# Patient Record
Sex: Male | Born: 1980 | Race: White | Hispanic: No | Marital: Single | Smoking: Never smoker
Health system: Southern US, Community
[De-identification: ages and names within clinical notes are randomized; demographics above are authoritative.]

## PROBLEM LIST (undated history)

## (undated) HISTORY — PX: APPENDECTOMY: SHX54

---
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
## 2004-12-28 ENCOUNTER — Emergency Department: Payer: Self-pay | Admitting: Internal Medicine

## 2008-01-21 ENCOUNTER — Inpatient Hospital Stay: Payer: Self-pay | Admitting: Vascular Surgery

## 2008-03-27 ENCOUNTER — Emergency Department (HOSPITAL_COMMUNITY): Admission: EM | Admit: 2008-03-27 | Discharge: 2008-03-28 | Payer: Self-pay | Admitting: Emergency Medicine

## 2009-07-08 ENCOUNTER — Emergency Department: Payer: Self-pay | Admitting: Emergency Medicine

## 2011-04-05 ENCOUNTER — Emergency Department: Payer: Self-pay | Admitting: *Deleted

## 2014-08-17 ENCOUNTER — Emergency Department: Payer: Self-pay | Admitting: Emergency Medicine

## 2014-08-19 ENCOUNTER — Emergency Department: Payer: Self-pay | Admitting: Student

## 2014-08-30 ENCOUNTER — Other Ambulatory Visit: Payer: Self-pay | Admitting: Unknown Physician Specialty

## 2017-08-20 ENCOUNTER — Emergency Department: Payer: Self-pay

## 2017-08-20 ENCOUNTER — Other Ambulatory Visit: Payer: Self-pay

## 2017-08-20 ENCOUNTER — Encounter: Payer: Self-pay | Admitting: Emergency Medicine

## 2017-08-20 ENCOUNTER — Emergency Department
Admission: EM | Admit: 2017-08-20 | Discharge: 2017-08-20 | Disposition: A | Discharge status: Home / Self Care | Payer: Self-pay | Attending: Student in an Organized Health Care Education/Training Program | Admitting: Student in an Organized Health Care Education/Training Program

## 2017-08-20 DIAGNOSIS — M109 Gout, unspecified: Secondary | ICD-10-CM | POA: Insufficient documentation

## 2017-08-20 DIAGNOSIS — Z87891 Personal history of nicotine dependence: Secondary | ICD-10-CM | POA: Insufficient documentation

## 2017-08-20 MED ORDER — COLCHICINE 0.6 MG PO TABS: ORAL_TABLET | ORAL | 0 refills | Status: DC

## 2017-08-20 MED ORDER — KETOROLAC TROMETHAMINE 30 MG/ML IJ SOLN
30.0000 mg | Freq: Once | INTRAMUSCULAR | Status: AC
  Administered 2017-08-20: 30 mg via INTRAMUSCULAR
  Filled 2017-08-20: qty 1

## 2017-08-20 MED ORDER — HYDROCODONE-ACETAMINOPHEN 5-325 MG PO TABS: 1.0000 | ORAL_TABLET | Freq: Four times a day (QID) | ORAL | 0 refills | Status: AC | PRN

## 2017-08-20 NOTE — ED Provider Notes (Signed)
[facility] Emergency Department Provider Note   ____________________________________________   First MD Initiated Contact with Patient 08/20/17 2204     (approximate)  I have reviewed the triage vital signs and the nursing notes.   HISTORY  Chief Complaint Foot Pain   HPI Luis Mcclain is a 37 y.o. male is here with complaint of left foot pain.  Patient denies any recent injury.  He states that this happened a couple months ago but went away on its own without any over-the-counter medication.  Patient states now for the last week pain is localized around the base of his left great toe and walking increases his pain.  He has not taken any over-the-counter medication for his pain.  Currently rates it an 8 out of 10.  History reviewed. No pertinent past medical history.  There are no active problems to display for this patient.   Past Surgical History:  Procedure Laterality Date  . APPENDECTOMY      Prior to Admission medications   Medication Sig Start Date End Date Taking? Authorizing Provider  colchicine 0.6 MG tablet 2 tablet with food once for first day, then 1 tablet bid x 2 days for gout 08/20/17   Tommi RumpsSummers, Brookley Spitler L, PA-C  HYDROcodone-acetaminophen (NORCO/VICODIN) 5-325 MG tablet Take 1 tablet by mouth every 6 (six) hours as needed for moderate pain. 08/20/17   Tommi RumpsSummers, Alexarae Oliva L, PA-C    Allergies Patient has no known allergies.  History reviewed. No pertinent family history.  Social History Social History   Tobacco Use  . Smoking status: Never Smoker  . Smokeless tobacco: Former Engineer, water[employer]  Substance Use Topics  . Alcohol use: Yes    Comment: occasional  . Drug use: No    Review of Systems Constitutional: No fever/chills Cardiovascular: Denies chest pain. Respiratory: Denies shortness of breath. Musculoskeletal: Positive for left foot pain. Skin: Negative for rash. Neurological: Negative for  focal weakness or  numbness. ____________________________________________   PHYSICAL EXAM:  VITAL SIGNS: ED Triage Vitals  Enc Vitals Group     BP 08/20/17 2034 (!) 143/70     Pulse Rate 08/20/17 2034 (!) 102     Resp 08/20/17 2034 17     Temp 08/20/17 2034 98.2 F (36.8 C)     Temp Source 08/20/17 2034 Oral     SpO2 08/20/17 2034 97 %     Weight 08/20/17 2034 220 lb (99.8 kg)     Height 08/20/17 2034 5\' 8"  (1.727 m)     Head Circumference --      Peak Flow --      Pain Score 08/20/17 2039 8     Pain Loc --      Pain Edu? --      Excl. in GC? --    Constitutional: Alert and oriented. Well appearing and in no acute distress. Eyes: Conjunctivae are normal.  Head: Atraumatic. Neck: No stridor.   Cardiovascular: Normal rate, regular rhythm. Grossly normal heart sounds.  Good peripheral circulation. Respiratory: Normal respiratory effort.  No retractions. Lungs CTAB. Musculoskeletal: Left foot no gross deformity is noted.  There is marked tenderness with erythema at the MP joint of the first digit.  Skin is intact but warm to touch in that area.  Range of motion is restricted secondary to pain.   Neurologic:  Normal speech and language. No gross focal neurologic deficits are appreciated.  Skin:  Skin is warm, dry and intact. No rash noted. Psychiatric: Mood and affect  are normal. Speech and behavior are normal.  ____________________________________________   LABS (all labs ordered are listed, but only abnormal results are displayed)  Labs Reviewed - No data to display  RADIOLOGY  ED MD interpretation:   Left foot x-ray is negative for acute bony injury.  Official radiology report(s): Dg Foot 2 Views Left  Result Date: 08/20/2017 CLINICAL DATA:  37 y/o  M; pain of first metatarsal head. EXAM: LEFT FOOT - 2 VIEW COMPARISON:  None. FINDINGS: There is no evidence of fracture or dislocation. There is no evidence of arthropathy or other focal bone abnormality. Soft tissues are unremarkable.  Plantar calcaneal enthesophyte. IMPRESSION: No acute bony or articular abnormality identified. Electronically Signed   By: Mitzi Hansen M.D.   On: 08/20/2017 21:16    ____________________________________________   PROCEDURES  Procedure(s) performed: None  Procedures  Critical Care performed: No  ____________________________________________   INITIAL IMPRESSION / ASSESSMENT AND PLAN / ED COURSE Patient was made aware that where he is hurting at and the erythematous area at the base of his great toe is suggestive for gout.  Patient was given an injection of Toradol 30 mg IM while in the department.  He was also given a prescription for Norco 1 every 6 hours as needed for pain and colchicine.  He is aware that he will discontinue taking this medication should he develop any diarrhea or stomach issues.  He is encouraged to [facility]-up with [facility] or [facility] if any continued problems. ____________________________________________   FINAL CLINICAL IMPRESSION(S) / ED DIAGNOSES  Final diagnoses:  Acute gout involving toe of left foot, unspecified cause     ED Discharge Orders        Ordered    HYDROcodone-acetaminophen (NORCO/VICODIN) 5-325 MG tablet  Every 6 hours PRN     08/20/17 2248    colchicine 0.6 MG tablet     08/20/17 2248       Note:  This document was prepared using Dragon voice recognition software and may include unintentional dictation errors.    Tommi Rumps, PA-C 08/20/17 2306    Willy Eddy, MD 08/20/17 [phone]

## 2017-08-20 NOTE — Discharge Instructions (Signed)
[facility] or [facility] if any continued problems with your foot.  A prescription for pain medication was written if needed.  Do not drive or operate machinery while taking this medication.  Colchicine only as directed.  Discontinue taking this medication should you develop diarrhea.

## 2017-08-20 NOTE — ED Triage Notes (Signed)
Pt reports left foot pain, specifically around joint of left great toe; pt says he's been working out and the pain has been there for a week; painful to ambulate; had a similar episode about 2 months ago and resolved after about 1 week without intervention;

## 2019-05-31 ENCOUNTER — Other Ambulatory Visit: Payer: Self-pay

## 2021-07-11 ENCOUNTER — Other Ambulatory Visit: Payer: Self-pay | Admitting: Internal Medicine

## 2021-07-11 DIAGNOSIS — R7989 Other specified abnormal findings of blood chemistry: Secondary | ICD-10-CM

## 2021-07-14 ENCOUNTER — Other Ambulatory Visit: Payer: Self-pay

## 2021-07-14 ENCOUNTER — Ambulatory Visit
Admission: RE | Admit: 2021-07-14 | Discharge: 2021-07-14 | Disposition: A | Discharge status: Home / Self Care | Payer: Self-pay | Source: Ambulatory Visit | Attending: Internal Medicine | Admitting: Internal Medicine

## 2021-07-14 DIAGNOSIS — R7989 Other specified abnormal findings of blood chemistry: Secondary | ICD-10-CM | POA: Insufficient documentation

## 2021-07-25 ENCOUNTER — Other Ambulatory Visit: Payer: Self-pay | Admitting: Nurse Practitioner

## 2021-07-25 DIAGNOSIS — K429 Umbilical hernia without obstruction or gangrene: Secondary | ICD-10-CM

## 2021-07-25 DIAGNOSIS — R1012 Left upper quadrant pain: Secondary | ICD-10-CM

## 2021-07-25 DIAGNOSIS — K7010 Alcoholic hepatitis without ascites: Secondary | ICD-10-CM

## 2021-07-25 DIAGNOSIS — K760 Fatty (change of) liver, not elsewhere classified: Secondary | ICD-10-CM

## 2021-08-01 ENCOUNTER — Ambulatory Visit: Payer: Self-pay

## 2021-08-13 ENCOUNTER — Ambulatory Visit: Payer: Self-pay

## 2021-09-08 ENCOUNTER — Ambulatory Visit: Payer: Self-pay

## 2021-09-18 ENCOUNTER — Other Ambulatory Visit: Payer: Self-pay

## 2021-09-18 ENCOUNTER — Emergency Department: Payer: Self-pay

## 2021-09-18 ENCOUNTER — Emergency Department
Admission: EM | Admit: 2021-09-18 | Discharge: 2021-09-18 | Disposition: A | Discharge status: Home / Self Care | Payer: Self-pay | Attending: Emergency Medicine | Admitting: Emergency Medicine

## 2021-09-18 DIAGNOSIS — Z7984 Long term (current) use of oral hypoglycemic drugs: Secondary | ICD-10-CM | POA: Insufficient documentation

## 2021-09-18 DIAGNOSIS — R300 Dysuria: Secondary | ICD-10-CM | POA: Insufficient documentation

## 2021-09-18 DIAGNOSIS — N50812 Left testicular pain: Secondary | ICD-10-CM | POA: Insufficient documentation

## 2021-09-18 DIAGNOSIS — E119 Type 2 diabetes mellitus without complications: Secondary | ICD-10-CM | POA: Insufficient documentation

## 2021-09-18 LAB — URINALYSIS, ROUTINE W REFLEX MICROSCOPIC
Bilirubin Urine: NEGATIVE
Glucose, UA: NEGATIVE mg/dL
Hgb urine dipstick: NEGATIVE
Ketones, ur: NEGATIVE mg/dL
Leukocytes,Ua: NEGATIVE
Nitrite: NEGATIVE
Protein, ur: NEGATIVE mg/dL
Specific Gravity, Urine: 1.025 (ref 1.005–1.030)
pH: 5 (ref 5.0–8.0)

## 2021-09-18 MED ORDER — IBUPROFEN 600 MG PO TABS
600.0000 mg | ORAL_TABLET | Freq: Once | ORAL | Status: AC
  Administered 2021-09-18: 600 mg via ORAL
  Filled 2021-09-18: qty 1

## 2021-09-18 NOTE — Discharge Instructions (Signed)
Taking ibuprofen or Tylenol as needed for pain or soreness.  If you have any continued symptoms you may follow-up with the urologist. ? ?Return to the ER immediately for new, worsening, or persistent severe testicular pain, swelling, difficulty urinating or pain on urination, penile discharge, fever, abdominal pain, vomiting, or any other new or worsening symptoms that concern you. ?

## 2021-09-18 NOTE — ED Notes (Signed)
Pt verbalized understanding of discharge instructions and follow-up care instructions. Pt advised if symptoms worsen to return to ED.  

## 2021-09-18 NOTE — ED Triage Notes (Signed)
Pt comes pov with left sided testicle pain and swelling since monday ?

## 2021-09-18 NOTE — ED Provider Notes (Signed)
? ?[facility] ?Provider Note ? ? ? Event Date/Time  ? First MD Initiated Contact with Patient 09/18/21 1855   ?  (approximate) ? ? ?History  ? ?Groin Swelling ? ? ?HPI ? ?Luis Mcclain is a 41 y.o. male with a history of diabetes on metformin who presents with left testicular pain, acute onset last night which awoke him from sleep.  It lasted for several hours and has now mostly resolved although he reports just some mild soreness in that area.  He states that the testicle feels like it is slightly swollen.  He also reports recent mild discomfort or sensation of warmth when urinating, but no frequency, urgency, or change in the color of his urine.  He denies any penile discharge.  He is sexually active with 1 partner.  He does reports doing some new vigorous abdominal exercises in the last few weeks and thinks this may have contributed. ? ? ? ?Physical Exam  ? ?Triage Vital Signs: ?ED Triage Vitals  ?Enc Vitals Group  ?   BP 09/18/21 1851 137/86  ?   Pulse Rate 09/18/21 1851 95  ?   Resp 09/18/21 1851 18  ?   Temp 09/18/21 1851 98.5 ?F (36.9 ?C)  ?   Temp Source 09/18/21 1851 Oral  ?   SpO2 09/18/21 1851 97 %  ?   Weight 09/18/21 1840 232 lb (105.2 kg)  ?   Height --   ?   Head Circumference --   ?   Peak Flow --   ?   Pain Score 09/18/21 1840 2  ?   Pain Loc --   ?   Pain Edu? --   ?   Excl. in GC? --   ? ? ?Most recent vital signs: ?Vitals:  ? 09/18/21 1851  ?BP: 137/86  ?Pulse: 95  ?Resp: 18  ?Temp: 98.5 ?F (36.9 ?C)  ?SpO2: 97%  ? ? ? ?General: Awake, no distress.  ?CV:  Good peripheral perfusion.  ?Resp:  Normal effort.  ?Abd:  No distention.  ?Other:  No significant scrotal swelling.  Testes nontender.  No palpable masses.  Normal penile meatus.  No rashes or lesions.  No inguinal lymphadenopathy or tenderness. ? ? ?ED Results / Procedures / Treatments  ? ?Labs ?(all labs ordered are listed, but only abnormal results are displayed) ?Labs Reviewed  ?URINALYSIS, ROUTINE W REFLEX  MICROSCOPIC - Abnormal; Notable for the following components:  ?    Result Value  ? Color, Urine YELLOW (*)   ? APPearance CLEAR (*)   ? All other components within normal limits  ? ? ? ?EKG ? ? ? ? ?RADIOLOGY ? ?US scrotum: I independently viewed and interpreted the images; there are no masses, hydrocele, inflammation, or other acute abnormality.  Radiology report confirms normal arterial and venous flow to both testicles and no acute abnormality. ? ? ?PROCEDURES: ? ?Critical Care performed: No ? ?Procedures ? ? ?MEDICATIONS ORDERED IN ED: ?Medications  ?ibuprofen (ADVIL) tablet 600 mg (600 mg Oral Given 09/18/21 2111)  ? ? ? ?IMPRESSION / MDM / ASSESSMENT AND PLAN / ED COURSE  ?I reviewed the triage vital signs and the nursing notes. ? ?41 year old male presents with an episode of atraumatic left testicular pain last night which is now mostly resolved along with some mild dysuria. ? ?On exam the patient is well-appearing and his vital signs are normal.  Testicular exam is unremarkable. ? ?Differential diagnosis includes, but is not limited to,  musculoskeletal pain, abdominal cramps, ureteral stone, UTI, epididymitis, orchitis, varicocele.  I have a very low suspicion for testicular torsion, and there is no palpable mass. ? ?We will obtain a UA, scrotal ultrasound, and reassess. ? ?----------------------------------------- ?9:27 PM on 09/18/2021 ?----------------------------------------- ? ?UA and ultrasound are both normal.  This supports likely muscular strain or spasm is the etiology of the patient's symptoms.  He is stable for discharge home.  I counseled him on the results of the work-up.  I have given him urology referral.  Return precautions given, and he expresses understanding. ? ? ?FINAL CLINICAL IMPRESSION(S) / ED DIAGNOSES  ? ?Final diagnoses:  ?Left testicular pain  ? ? ? ?Rx / DC Orders  ? ?ED Discharge Orders   ? ? None  ? ?  ? ? ? ?Note:  This document was prepared using Dragon voice recognition  software and may include unintentional dictation errors.  ?  Dionne Bucy, MD ?09/18/21 2128 ? ?

## 2021-09-18 NOTE — ED Notes (Signed)
Pt c/o swelling that started about a week ago. Pt states he came today because the pain was getting worse. Pt also c/o increased urge to urinate that started this past week ago.  ?

## 2022-08-24 IMAGING — US US SCROTUM W/ DOPPLER COMPLETE
1 series · 14 of 25 positions shown · non-contrast
Comparison: None.

CLINICAL DATA: Left-sided testicular pain and swelling for several
days

EXAM:
SCROTAL ULTRASOUND
DOPPLER ULTRASOUND OF THE TESTICLES
TECHNIQUE: Complete ultrasound examination of the testicles, epididymis, and
other scrotal structures was performed. Color and spectral Doppler
ultrasound were also utilized to evaluate blood flow to the
testicles.

[Series 1: us scrotum w/doppler · 14 of 54 slices shown]
[im 1/54]
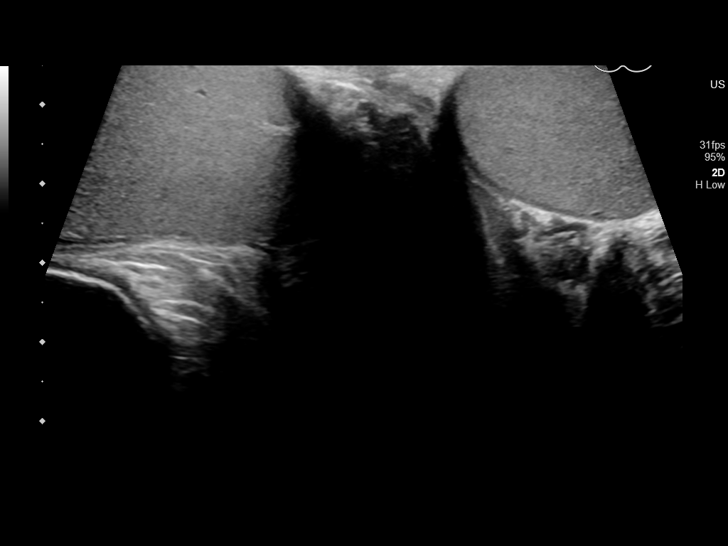
[im 5/54]
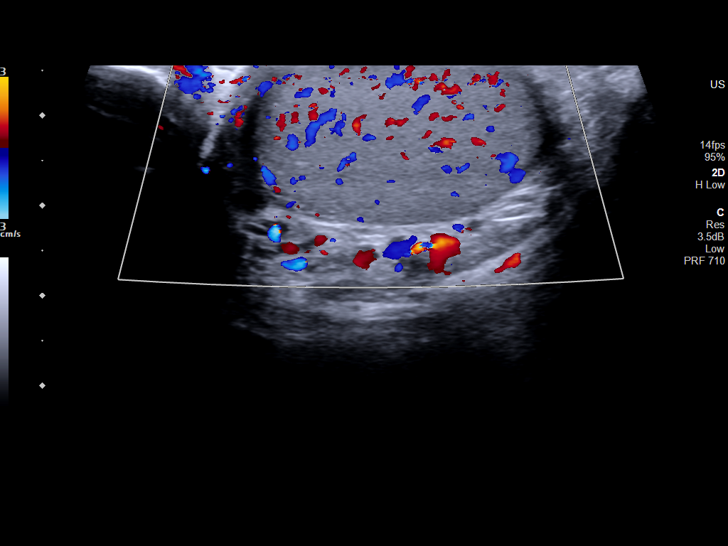
[im 9/54]
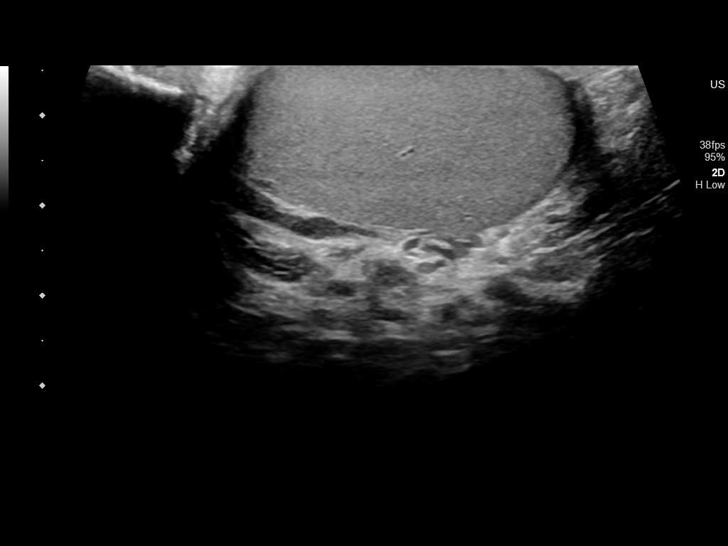
[im 14/54]
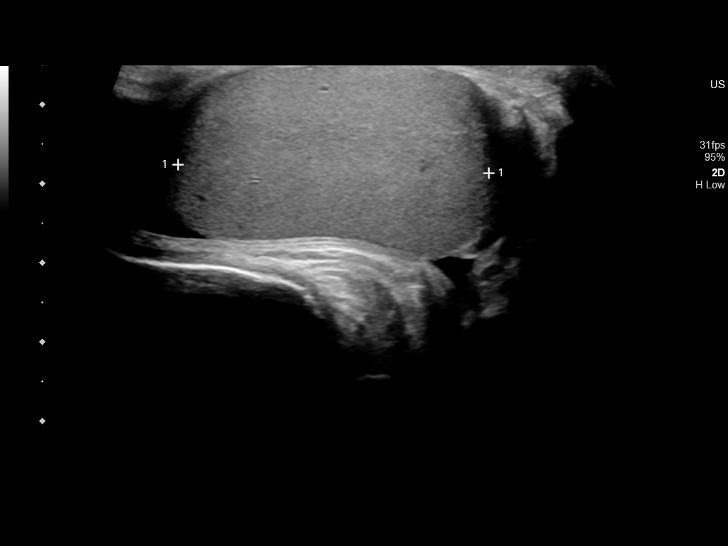
[im 18/54]
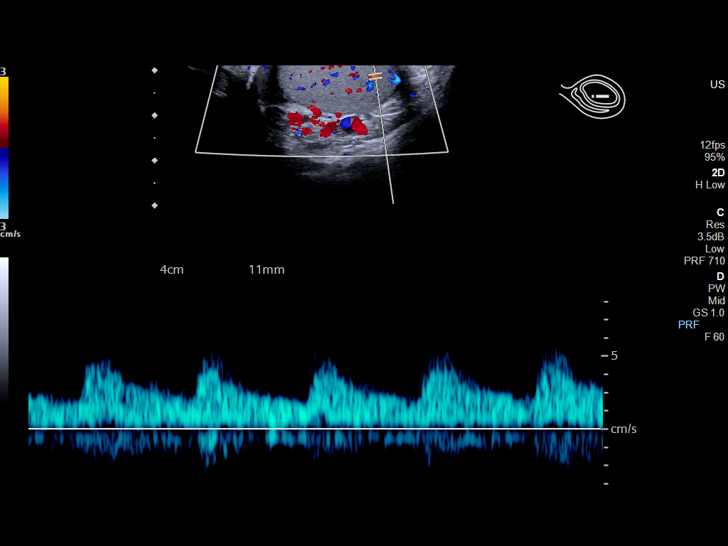
[im 20/54]
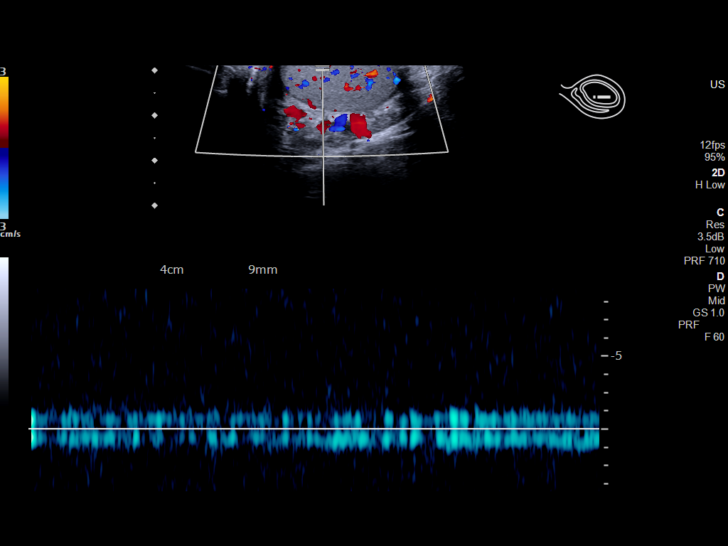
[im 25/54]
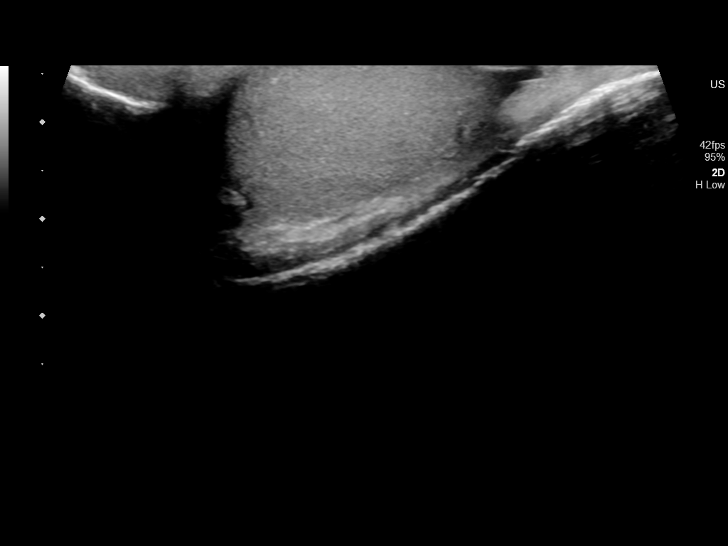
[im 29/54]
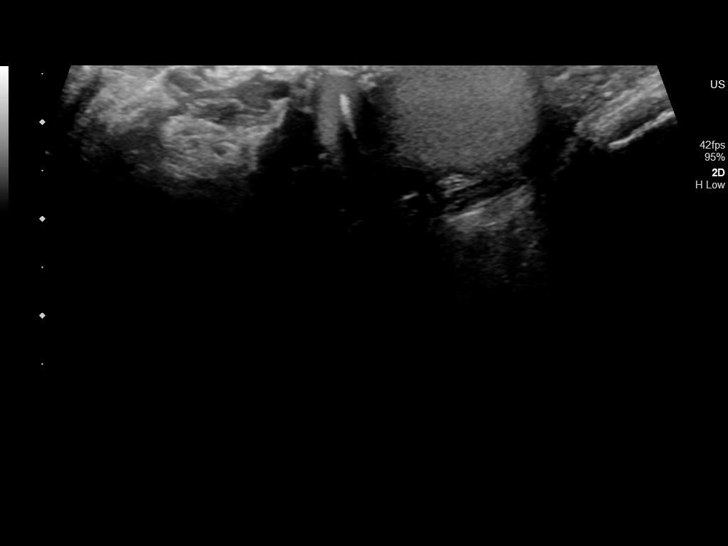
[im 34/54]
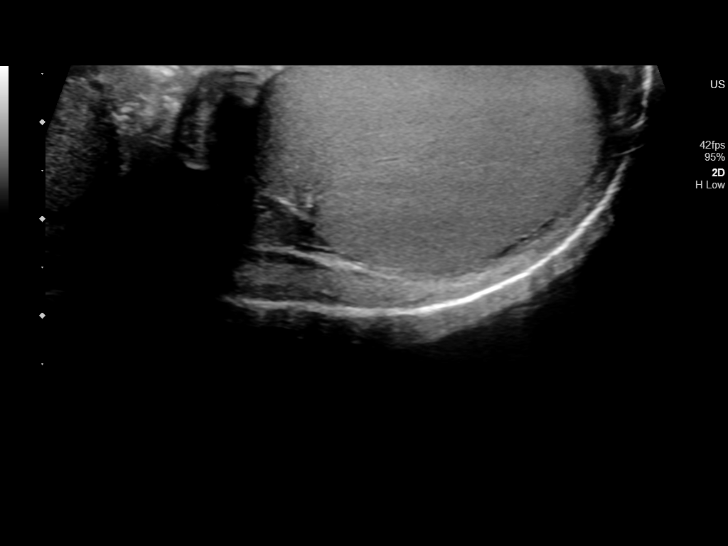
[im 36/54]
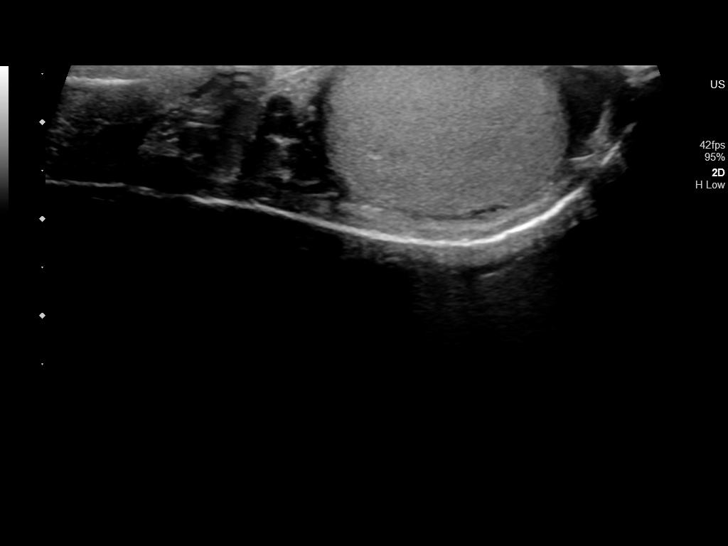
[im 40/54]
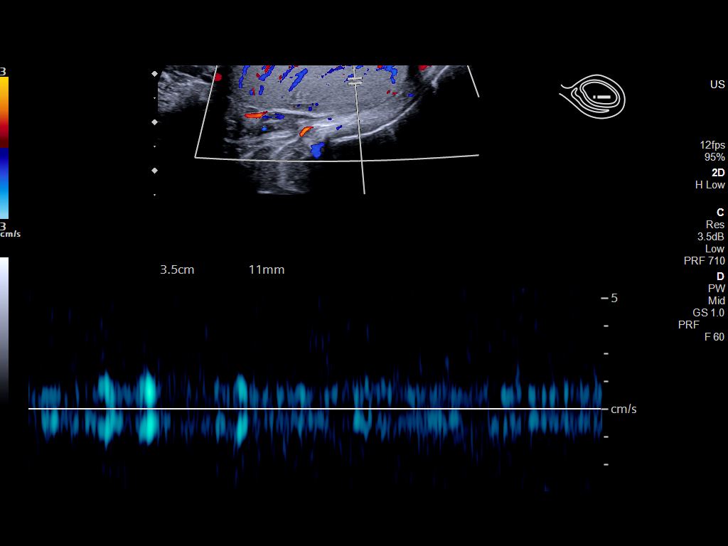
[im 45/54]
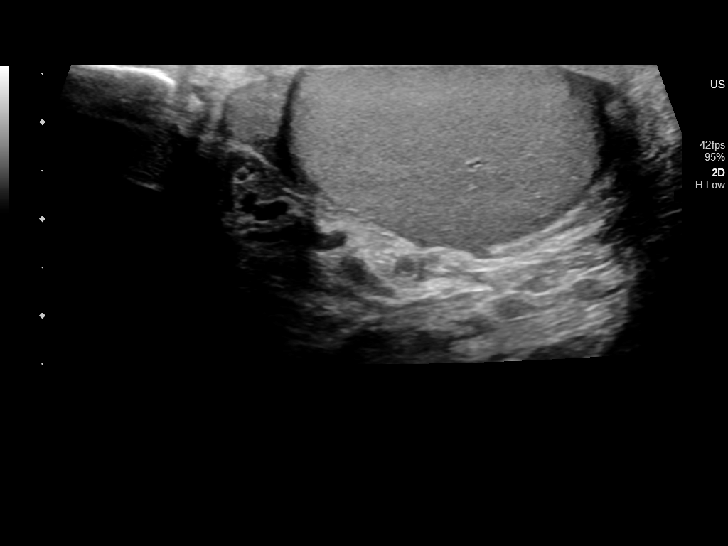
[im 49/54]
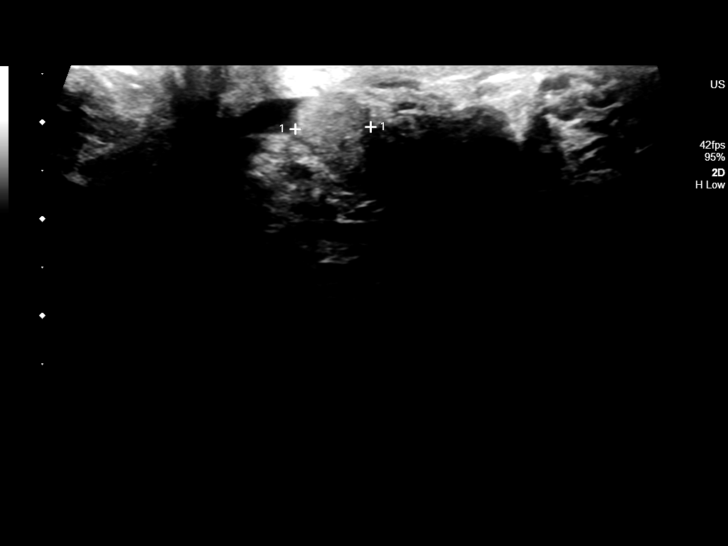
[im 54/54]
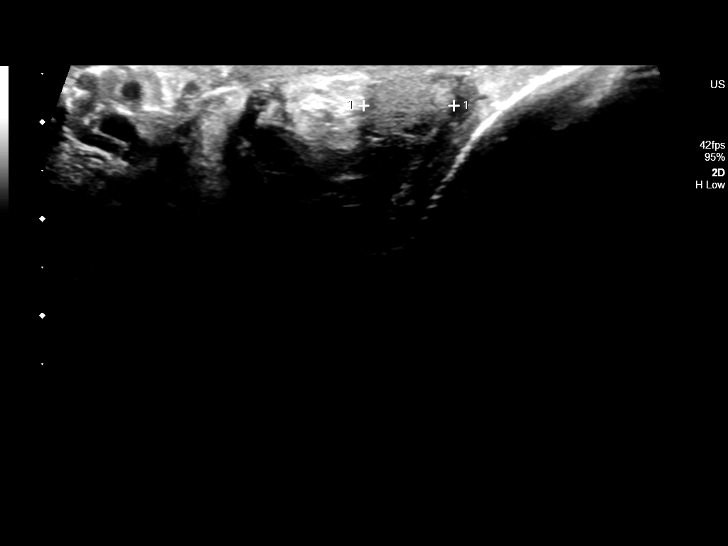

[14 of 25 positions shown; findings below may reference images not displayed]

FINDINGS: Right testicle

Measurements: 3.5 x 2.0 x 3.9 cm. No mass or microlithiasis
visualized.

Left testicle

Measurements: 4.9 x 2.2 x 3.3 cm. No mass or microlithiasis
visualized.

Right epididymis:  Normal in size and appearance.

Left epididymis:  Normal in size and appearance.

Hydrocele:  None visualized.

Varicocele:  None visualized.

Pulsed Doppler interrogation of both testes demonstrates normal low
resistance arterial and venous waveforms bilaterally.
IMPRESSION: No acute abnormality in the testicles.

## 2023-08-25 ENCOUNTER — Ambulatory Visit: Payer: Self-pay | Admitting: Family Medicine

## 2023-08-25 ENCOUNTER — Emergency Department
Admission: EM | Admit: 2023-08-25 | Discharge: 2023-08-25 | Disposition: A | Discharge status: Home / Self Care | Payer: Self-pay | Attending: Emergency Medicine | Admitting: Emergency Medicine

## 2023-08-25 ENCOUNTER — Emergency Department: Payer: Self-pay

## 2023-08-25 ENCOUNTER — Other Ambulatory Visit: Payer: Self-pay

## 2023-08-25 DIAGNOSIS — E119 Type 2 diabetes mellitus without complications: Secondary | ICD-10-CM | POA: Insufficient documentation

## 2023-08-25 DIAGNOSIS — R5383 Other fatigue: Secondary | ICD-10-CM | POA: Insufficient documentation

## 2023-08-25 DIAGNOSIS — R531 Weakness: Secondary | ICD-10-CM | POA: Insufficient documentation

## 2023-08-25 DIAGNOSIS — M1A9XX0 Chronic gout, unspecified, without tophus (tophi): Secondary | ICD-10-CM | POA: Insufficient documentation

## 2023-08-25 DIAGNOSIS — R42 Dizziness and giddiness: Secondary | ICD-10-CM | POA: Insufficient documentation

## 2023-08-25 LAB — CBC
HCT: 43 % (ref 39.0–52.0)
Hemoglobin: 15 g/dL (ref 13.0–17.0)
MCH: 31.9 pg (ref 26.0–34.0)
MCHC: 34.9 g/dL (ref 30.0–36.0)
MCV: 91.5 fL (ref 80.0–100.0)
Platelets: 309 10*3/uL (ref 150–400)
RBC: 4.7 MIL/uL (ref 4.22–5.81)
RDW: 12.6 % (ref 11.5–15.5)
WBC: 7.1 10*3/uL (ref 4.0–10.5)
nRBC: 0.3 % — ABNORMAL HIGH (ref 0.0–0.2)

## 2023-08-25 LAB — BASIC METABOLIC PANEL
Anion gap: 11 (ref 5–15)
BUN: 16 mg/dL (ref 6–20)
CO2: 23 mmol/L (ref 22–32)
Calcium: 9.3 mg/dL (ref 8.9–10.3)
Chloride: 101 mmol/L (ref 98–111)
Creatinine, Ser: 0.86 mg/dL (ref 0.61–1.24)
GFR, Estimated: >60 mL/min (ref >60)
Glucose, Bld: 129 mg/dL — ABNORMAL HIGH (ref 70–99)
Potassium: 3.9 mmol/L (ref 3.5–5.1)
Sodium: 135 mmol/L (ref 135–145)

## 2023-08-25 LAB — TROPONIN I (HIGH SENSITIVITY)
Troponin I (High Sensitivity): 4 ng/L (ref <18)
Troponin I (High Sensitivity): 4 ng/L (ref <18)

## 2023-08-25 LAB — CBG MONITORING, ED: Glucose-Capillary: 122 mg/dL — ABNORMAL HIGH (ref 70–99)

## 2023-08-25 MED ORDER — ALBUTEROL SULFATE HFA 108 (90 BASE) MCG/ACT IN AERS: 2.0000 | INHALATION_SPRAY | Freq: Four times a day (QID) | RESPIRATORY_TRACT | 0 refills | Status: AC | PRN

## 2023-08-25 MED ORDER — COLCHICINE 0.6 MG PO TABS: 1.2000 mg | ORAL_TABLET | Freq: Every day | ORAL | 0 refills | Status: AC | PRN

## 2023-08-25 NOTE — ED Triage Notes (Addendum)
 Pt states he is a Type 2 diabetic who states he has not been taking his metformin for over a year like he was RX'ed. Pt states he has been feeling like he has fatigue, brain fog and some disorientation but is alert and calm and A&O4.   Pt also endorses gout and started new meds for it.   CBG 122 in triage

## 2023-08-25 NOTE — ED Provider Notes (Signed)
 [facility] Provider Note    Event Date/Time   First MD Initiated Contact with Patient 08/25/23 2214     (approximate)   History   Chief Complaint Fatigue and Shortness of Breath   HPI  Luis Mcclain is a 43 y.o. male with past medical history of diabetes who presents to the ED complaining of fatigue and generalized weakness.  Patient reports that he has been feeling generally unwell for about the past 6 days with generalized weakness, fatigue, and feeling "foggy headed."  He states he is having trouble recalling basic info at times and has felt dizzy and lightheaded at other times.  He additionally reports intermittent gout flares in his left great toe.      Physical Exam   Triage Vital Signs: ED Triage Vitals  Encounter Vitals Group     BP 08/25/23 1655 (!) 142/109     Systolic BP Percentile --      Diastolic BP Percentile --      Pulse Rate 08/25/23 1655 99     Resp 08/25/23 1655 18     Temp 08/25/23 1655 98.5 F (36.9 C)     Temp Source 08/25/23 1655 Oral     SpO2 08/25/23 1655 97 %     Weight 08/25/23 1656 231 lb 7.7 oz (105 kg)     Height 08/25/23 1656 5\' 8"  (1.727 m)     Head Circumference --      Peak Flow --      Pain Score 08/25/23 1655 4     Pain Loc --      Pain Education --      Exclude from Growth Chart --     Most recent vital signs: Vitals:   08/25/23 1655 08/25/23 2058  BP: (!) 142/109 (!) 133/90  Pulse: 99 88  Resp: 18 19  Temp: 98.5 F (36.9 C) 98.7 F (37.1 C)  SpO2: 97% 98%    Constitutional: Alert and oriented. Eyes: Conjunctivae are normal. Head: Atraumatic. Nose: No congestion/rhinnorhea. Mouth/Throat: Mucous membranes are moist.  Cardiovascular: Normal rate, regular rhythm. Grossly normal heart sounds.  2+ radial pulses bilaterally. Respiratory: Normal respiratory effort.  No retractions. Lungs CTAB. Gastrointestinal: Soft and nontender. No distention. Musculoskeletal: No lower extremity tenderness  nor edema.  Neurologic:  Normal speech and language. No gross focal neurologic deficits are appreciated.    ED Results / Procedures / Treatments   Labs (all labs ordered are listed, but only abnormal results are displayed) Labs Reviewed  BASIC METABOLIC PANEL - Abnormal; Notable for the following components:      Result Value   Glucose, Bld 129 (*)    All other components within normal limits  CBC - Abnormal; Notable for the following components:   nRBC 0.3 (*)    All other components within normal limits  CBG MONITORING, ED - Abnormal; Notable for the following components:   Glucose-Capillary 122 (*)    All other components within normal limits  TROPONIN I (HIGH SENSITIVITY)  TROPONIN I (HIGH SENSITIVITY)     EKG  ED ECG REPORT I, Chesley Noon, the attending physician, personally viewed and interpreted this ECG.   Date: 08/25/2023  EKG Time: 16:56  Rate: 92  Rhythm: normal sinus rhythm  Axis: Normal  Intervals:none  ST&T Change: None  RADIOLOGY CT head reviewed and interpreted by me with no hemorrhage or midline shift.  PROCEDURES:  Critical Care performed: No  Procedures   MEDICATIONS ORDERED IN ED: Medications -  No data to display   IMPRESSION / MDM / ASSESSMENT AND PLAN / ED COURSE  I reviewed the triage vital signs and the nursing notes.                              43 y.o. male with past medical history of diabetes who presents to the ED complaining of generalized weakness, fatigue, feeling "foggy headed", and recent gout flare over the past 6 days.  Patient's presentation is most consistent with acute presentation with potential threat to life or bodily function.  Differential diagnosis includes, but is not limited to, hyperglycemia, hypoglycemia, anemia, electrolyte abnormality, AKI, ACS, gout.  Patient nontoxic-appearing and in no acute distress, vital signs are unremarkable.  He has no focal neurologic deficits on exam and CT head is negative  for acute process.  EKG shows no evidence arrhythmia or ischemia and 2 sets of troponin are within normal limits.  No significant anemia, leukocytosis, electrolyte abnormality, or AKI noted.  No findings concerning for sepsis or other infectious process.  Given reassuring workup, patient is appropriate for discharge home with outpatient follow-up.  We will provide colchicine as needed for gout at patient request, also prescribe albuterol.  Patient was counseled to follow-up with PCP and to return to the ED for new or worsening symptoms, patient agrees with plan.      FINAL CLINICAL IMPRESSION(S) / ED DIAGNOSES   Final diagnoses:  Fatigue, unspecified type  Chronic gout without tophus, unspecified cause, unspecified site     Rx / DC Orders   ED Discharge Orders          Ordered    albuterol (VENTOLIN HFA) 108 (90 Base) MCG/ACT inhaler  Every 6 hours PRN       Note to Pharmacy: Please supply with spacer   08/25/23 2258    colchicine 0.6 MG tablet  Daily PRN        08/25/23 2258             Note:  This document was prepared using Dragon voice recognition software and may include unintentional dictation errors.   Chesley Noon, MD 08/26/23 0000

## 2023-08-25 NOTE — ED Provider Triage Note (Signed)
 Emergency Medicine Provider Triage Evaluation Note  Luis Mcclain , a 43 y.o. male  was evaluated in triage.  Pt complains of SOB, cephalea. History of  DM 2   Review of Systems  Positive:  Negative:   Physical Exam  BP (!) 142/109 (BP Location: Right Arm)   Pulse 99   Temp 98.5 F (36.9 C) (Oral)   Resp 18   Ht 5\' 8"  (1.727 m)   Wt 105 kg   SpO2 97%   BMI 35.20 kg/m  Gen:   Awake, no distress   Resp:  Normal effort  MSK:   Moves extremities without difficulty  Other:   Medical Decision Making  Medically screening exam initiated at 5:03 PM.  Appropriate orders placed.  Luis Mcclain was informed that the remainder of the evaluation will be completed by another provider, this initial triage assessment does not replace that evaluation, and the importance of remaining in the ED until their evaluation is complete.  Patient with HTN, ordered head CT. basics   Gladys Damme, PA-C 08/25/23 1704

## 2023-10-18 ENCOUNTER — Ambulatory Visit: Payer: Self-pay | Admitting: Physician Assistant

## 2023-10-18 NOTE — Progress Notes (Deleted)
 New patient visit  Patient: Luis Mcclain   DOB: 07-Sep-1980   43 y.o. Male  MRN: [id number] Visit Date: 10/18/2023  Today's healthcare provider: Blane Bunting, PA-C   No chief complaint on file.  Subjective    Luis Mcclain is a 43 y.o. male who presents today as a new patient to establish care.  HPI  *** Discussed the use of AI scribe software for clinical note transcription with the patient, who gave verbal consent to proceed.  History of Present Illness     No past medical history on file. Past Surgical History:  Procedure Laterality Date   APPENDECTOMY     No family status information on file.   No family history on file. Social History   Socioeconomic History   Marital status: Single    Spouse name: Not on file   Number of children: Not on file   Years of education: Not on file   Highest education level: Not on file  Occupational History   Not on file  Tobacco Use   Smoking status: Never   Smokeless tobacco: Former  Substance and Sexual Activity   Alcohol use: Yes    Comment: occasional   Drug use: No   Sexual activity: Not on file  Other Topics Concern   Not on file  Social History Narrative   Not on file   Social Drivers of Health   Financial Resource Strain: Not on file  Food Insecurity: Not on file  Transportation Needs: Not on file  Physical Activity: Not on file  Stress: Not on file  Social Connections: Not on file   Outpatient Medications Prior to Visit  Medication Sig   albuterol  (VENTOLIN  HFA) 108 (90 Base) MCG/ACT inhaler Inhale 2 puffs into the lungs every 6 (six) hours as needed for wheezing or shortness of breath.   colchicine  0.6 MG tablet Take 2 tablets (1.2 mg total) by mouth daily as needed.   HYDROcodone -acetaminophen  (NORCO/VICODIN) 5-325 MG tablet Take 1 tablet by mouth every 6 (six) hours as needed for moderate pain.   No facility-administered medications prior to visit.   No Known Allergies  Immunization History   Administered Date(s) Administered   PFIZER(Purple Top)SARS-COV-2 Vaccination 02/27/2020, 03/19/2020    Health Maintenance  Topic Date Due   HIV Screening  Never done   Hepatitis C Screening  Never done   DTaP/Tdap/Td (1 - Tdap) Never done   Pneumococcal Vaccine 35-49 Years old (1 of 2 - PCV) Never done   COVID-19 Vaccine (3 - 2024-25 season) 02/14/2023   INFLUENZA VACCINE  01/14/2024   HPV VACCINES  Aged Out   Meningococcal B Vaccine  Aged Out    Patient Care Team: Pcp, No as PCP - General  Review of Systems  All other systems reviewed and are negative.  Except see HPI   {Insert previous labs (optional):23779} {See past labs  Heme  Chem  Endocrine  Serology  Results Review (optional):1}   Objective    There were no vitals taken for this visit. {Insert last BP/Wt (optional):23777}{See vitals history (optional):1}   Physical Exam Vitals reviewed.  Constitutional:      General: He is not in acute distress.    Appearance: Normal appearance. He is not diaphoretic.  HENT:     Head: Normocephalic and atraumatic.  Eyes:     General: No scleral icterus.    Conjunctiva/sclera: Conjunctivae normal.  Cardiovascular:     Rate and Rhythm: Normal rate and regular rhythm.  Pulses: Normal pulses.     Heart sounds: Normal heart sounds. No murmur heard. Pulmonary:     Effort: Pulmonary effort is normal. No respiratory distress.     Breath sounds: Normal breath sounds. No wheezing or rhonchi.  Musculoskeletal:     Cervical back: Neck supple.     Right lower leg: No edema.     Left lower leg: No edema.  Lymphadenopathy:     Cervical: No cervical adenopathy.  Skin:    General: Skin is warm and dry.     Findings: No rash.  Neurological:     Mental Status: He is alert and oriented to person, place, and time. Mental status is at baseline.  Psychiatric:        Mood and Affect: Mood normal.        Behavior: Behavior normal.     Depression Screen     No data to  display         No results found for any visits on 10/18/23.  Assessment & Plan     *** Assessment and Plan Assessment & Plan      Encounter to establish care Welcomed to our clinic Reviewed past medical hx, social hx, family hx and surgical hx Pt advised to send all vaccination records or screening   No follow-ups on file.    The patient was advised to call back or seek an in-person evaluation if the symptoms worsen or if the condition fails to improve as anticipated.  I discussed the assessment and treatment plan with the patient. The patient was provided an opportunity to ask questions and all were answered. The patient agreed with the plan and demonstrated an understanding of the instructions.  I, Xaiver Roskelley, PA-C have reviewed all documentation for this visit. The documentation on  10/18/2023   for the exam, diagnosis, procedures, and orders are all accurate and complete.  Blane Bunting, [facility], MMS [facility] [phone] (phone) [phone] (fax)  [facility]

## 2023-11-04 ENCOUNTER — Ambulatory Visit (INDEPENDENT_AMBULATORY_CARE_PROVIDER_SITE_OTHER): Payer: Self-pay | Admitting: Physician Assistant

## 2023-11-04 VITALS — BP 114/74 | HR 84 | Resp 16 | Ht 67.0 in | Wt 220.0 lb

## 2023-11-04 DIAGNOSIS — F1290 Cannabis use, unspecified, uncomplicated: Secondary | ICD-10-CM

## 2023-11-04 DIAGNOSIS — Z7689 Persons encountering health services in other specified circumstances: Secondary | ICD-10-CM

## 2023-11-04 DIAGNOSIS — F339 Major depressive disorder, recurrent, unspecified: Secondary | ICD-10-CM

## 2023-11-04 DIAGNOSIS — R5383 Other fatigue: Secondary | ICD-10-CM

## 2023-11-04 DIAGNOSIS — M1A4720 Other secondary chronic gout, left ankle and foot, without tophus (tophi): Secondary | ICD-10-CM

## 2023-11-04 DIAGNOSIS — R0602 Shortness of breath: Secondary | ICD-10-CM

## 2023-11-04 DIAGNOSIS — A630 Anogenital (venereal) warts: Secondary | ICD-10-CM

## 2023-11-04 DIAGNOSIS — R7303 Prediabetes: Secondary | ICD-10-CM

## 2023-11-04 DIAGNOSIS — R748 Abnormal levels of other serum enzymes: Secondary | ICD-10-CM

## 2023-11-04 DIAGNOSIS — R739 Hyperglycemia, unspecified: Secondary | ICD-10-CM

## 2023-11-04 DIAGNOSIS — F1010 Alcohol abuse, uncomplicated: Secondary | ICD-10-CM

## 2023-11-04 DIAGNOSIS — E66811 Obesity, class 1: Secondary | ICD-10-CM

## 2023-11-04 DIAGNOSIS — F419 Anxiety disorder, unspecified: Secondary | ICD-10-CM

## 2023-11-04 NOTE — Progress Notes (Addendum)
 New patient visit  Patient: Luis Mcclain   DOB: 09-May-1981   43 y.o. Male  MRN: [id number] Visit Date: 11/04/2023  Today's healthcare provider: Blane Bunting, PA-C   Chief Complaint  Patient presents with   New Patient (Initial Visit)    NP/ Est Care .Luis Mcclain Pt stated Luis Mcclain put Luis Mcclain concerns on mychart   Subjective    Luis Mcclain is a 43 y.o. male who presents today as a new patient to establish care.   Discussed the use of AI scribe software for clinical note transcription with the patient, who gave verbal consent to proceed.  History of Present Illness Luis Mcclain is a 43 year old male with gout and prediabetes who presents for a comprehensive evaluation of multiple health concerns.  Luis Mcclain experiences recurrent gout episodes affecting Luis Mcclain left toe, often triggered by certain foods like fast food and shrimp. Luis Mcclain is on allopurinol but is running low. No tophi are present.  Luis Mcclain reports fatigue, low energy, and brain fog, suspecting low testosterone. Luis Mcclain has lost 20 pounds over a year through diet and exercise, maintaining a caloric intake of 1800-2000 calories. Luis Mcclain consumes alcohol occasionally and drinks water daily.  Luis Mcclain has prediabetes and previously used metformin, which Luis Mcclain has discontinued. Luis Mcclain does not regularly monitor blood sugar levels. Luis Mcclain has a history of elevated liver enzymes with a negative Fibroscan for fibrosis. No symptoms of acute hepatitis.  Luis Mcclain experiences occasional shortness of breath, particularly after COVID-19, and uses albuterol  as needed. Luis Mcclain has a history of smoking, quitting 12 years ago, and has occasionally smoked marijuana. Luis Mcclain vaped tobacco for a year but stopped due to lung discomfort.  Luis Mcclain experiences depression and anxiety related to life stressors, with increased worrying over the past three years.  Luis Mcclain has a history of genital warts without treatment and is concerned about health implications and risk to Luis Mcclain wife.  Luis Mcclain experiences episodes of lightheadedness  and near-syncope, with a recent hospital visit showing normal blood work and imaging. Luis Mcclain experiences numbness and tingling in Luis Mcclain middle toe when standing for long periods or playing sports.  Luis Mcclain has a history of rectal bleeding associated with certain medications or vitamins. A colonoscopy ten years ago was normal. No family history of colorectal issues.    History reviewed. No pertinent past medical history. Past Surgical History:  Procedure Laterality Date   APPENDECTOMY     No family status information on file.   History reviewed. No pertinent family history. Social History   Socioeconomic History   Marital status: Single    Spouse name: Not on file   Number of children: Not on file   Years of education: Not on file   Highest education level: GED or equivalent  Occupational History   Not on file  Tobacco Use   Smoking status: Never   Smokeless tobacco: Former  Substance and Sexual Activity   Alcohol use: Yes    Comment: occasional   Drug use: No   Sexual activity: Not on file  Other Topics Concern   Not on file  Social History Narrative   Not on file   Social Drivers of Health   Financial Resource Strain: Low Risk  (11/04/2023)   Overall Financial Resource Strain (CARDIA)    Difficulty of Paying Living Expenses: Not very hard  Food Insecurity: No Food Insecurity (11/04/2023)   Hunger Vital Sign    Worried About Running Out of Food in the Last Year: Never true    Ran Out  of Food in the Last Year: Never true  Transportation Needs: No Transportation Needs (11/04/2023)   PRAPARE - Administrator, Civil Service (Medical): No    Lack of Transportation (Non-Medical): No  Physical Activity: Sufficiently Active (11/04/2023)   Exercise Vital Sign    Days of Exercise per Week: 5 days    Minutes of Exercise per Session: 120 min  Stress: Stress Concern Present (11/04/2023)   [employer] of Occupational Health - Occupational Stress Questionnaire    Feeling of  Stress : Very much  Social Connections: Socially Integrated (11/04/2023)   Social Connection and Isolation Panel [NHANES]    Frequency of Communication with Friends and Family: Twice a week    Frequency of Social Gatherings with Friends and Family: Twice a week    Attends Religious Services: More than 4 times per year    Active Member of [employer] or Organizations: Yes    Attends Engineer, structural: More than 4 times per year    Marital Status: Married   Outpatient Medications Prior to Visit  Medication Sig   albuterol  (VENTOLIN  HFA) 108 (90 Base) MCG/ACT inhaler Inhale 2 puffs into the lungs every 6 (six) hours as needed for wheezing or shortness of breath.   colchicine  0.6 MG tablet Take 2 tablets (1.2 mg total) by mouth daily as needed.   HYDROcodone -acetaminophen  (NORCO/VICODIN) 5-325 MG tablet Take 1 tablet by mouth every 6 (six) hours as needed for moderate pain. (Patient not taking: Reported on 11/04/2023)   No facility-administered medications prior to visit.   Allergies  Allergen Reactions   Sulfa Antibiotics     Other reaction(s): Unknown    Immunization History  Administered Date(s) Administered   PFIZER(Purple Top)SARS-COV-2 Vaccination 02/27/2020, 03/19/2020    Health Maintenance  Topic Date Due   HIV Screening  Never done   Hepatitis C Screening  Never done   DTaP/Tdap/Td (1 - Tdap) Never done   Pneumococcal Vaccine 74-66 Years old (1 of 2 - PCV) Never done   COVID-19 Vaccine (3 - 2024-25 season) 02/14/2023   INFLUENZA VACCINE  01/14/2024   HPV VACCINES  Aged Out   Meningococcal B Vaccine  Aged Out    Patient Care Team: Sulema Braid, PA-C as PCP - General (Physician Assistant)  Review of Systems Except see HPI       Objective    BP 114/74 (BP Location: Left Arm, Patient Position: Sitting, Cuff Size: Normal)   Pulse 84   Resp 16   Ht 5\' 7"  (1.702 m)   Wt 220 lb (99.8 kg)   SpO2 100%   BMI 34.46 kg/m     Physical Exam Vitals reviewed.   Constitutional:      General: Luis Mcclain is not in acute distress.    Appearance: Normal appearance. Luis Mcclain is not diaphoretic.  HENT:     Head: Normocephalic and atraumatic.  Eyes:     General: No scleral icterus.    Conjunctiva/sclera: Conjunctivae normal.  Cardiovascular:     Rate and Rhythm: Normal rate and regular rhythm.     Pulses: Normal pulses.     Heart sounds: Normal heart sounds. No murmur heard. Pulmonary:     Effort: Pulmonary effort is normal. No respiratory distress.     Breath sounds: Normal breath sounds. No wheezing or rhonchi.  Musculoskeletal:     Cervical back: Neck supple.     Right lower leg: No edema.     Left lower leg: No edema.  Lymphadenopathy:  Cervical: No cervical adenopathy.  Skin:    General: Skin is warm and dry.     Findings: No rash.  Neurological:     Mental Status: Luis Mcclain is alert and oriented to person, place, and time. Mental status is at baseline.  Psychiatric:        Mood and Affect: Mood normal.        Behavior: Behavior normal.     Depression Screen    11/04/2023    4:33 PM  PHQ 2/9 Scores  PHQ - 2 Score 3  PHQ- 9 Score 12   No results found for any visits on 11/04/23.  Assessment & Plan      Assessment & Plan Gout Chronic gout in left toe, exacerbated by certain foods. Managed with colchicine , uric acid level at 9.6 necessitates continued therapy. - Check uric acid levels and kidney function - Prescribe allopurinol for regular use. - Prescribe colchicine  for acute flare-ups as needed. Will reassess  Hyperglycemia Found on 08/25/2023  Discussed importance of lifestyle modifications. - Check HbA1c. - Discuss lifestyle modifications for blood sugar control. Will follow-up  Shortness of breath Intermittent shortness of breath possibly related to past smoking and COVID-19. Occasional albuterol  use. - Prescribe albuterol  as needed for shortness of breath.  Depression and anxiety Increased depression and anxiety possibly due to  life stressors. Considering testosterone check due to fatigue and weight issues. Plan to evaluate for anemia, thyroid dysfunction, or electrolyte imbalances. - Check testosterone levels. - Check TSH levels. - Check for anemia, liver function, kidney function, and electrolytes.  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 Discussed partner health risks and importance of HPV screening and vaccination. - Refer to dermatology for management of genital warts. - Advise use of condoms to protect partner. - Discuss HPV vaccination for both patient and partner. - Advise partner to see Luis Mcclain provider for HPV screening.  History of elevated liver enzymes Previous elevated liver enzymes with possible fibrosis, recent tests normal. Discussed coffee benefits for liver health. - Check liver function tests. - Advise on lifestyle modifications, including coffee consumption for liver health.   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 Uric acid  Depression, recurrent (HCC)    11/04/2023    4:33 PM  PHQ9 SCORE ONLY  PHQ-9 Total Score 12    Anxiety    11/04/2023    4:35 PM  GAD 7 : Generalized Anxiety Score  Nervous, Anxious, on Edge 3  Control/stop worrying 3  Worry too much - different things 3  Trouble relaxing 3  Restless 3  Easily annoyed or irritable 3  Afraid - awful might happen 3  Total GAD 7 Score 21  Anxiety Difficulty Very difficult    Other fatigue Chronic, unclear etiology Ordered - FSH/LH - Testosterone,Free and Total - CBC with Differential/Platelet - Comprehensive metabolic panel with  GFR - Lipid panel - TSH - Hemoglobin A1c - Uric acid Will reassess Advised healthy diet and regular exercise   Obesity (BMI 30.0-34.9) Chronic Body mass index is 34.46 kg/m. Advised weight loss via healthy diet and regular exercise Workup - FSH/LH - Testosterone,Free and  Total - CBC with Differential/Platelet - Comprehensive metabolic panel with GFR - Lipid panel - TSH - Hemoglobin A1c - Uric acid Will reassess after receiving lab results  Marijuana smoker Chronic Cessation advised Will follow-up  Shortness of breath Chronic Due to smoking/vaping - FSH/LH - Testosterone,Free and Total - CBC with Differential/Platelet - Comprehensive metabolic panel with GFR - Lipid panel - TSH - Hemoglobin A1c - Uric acid Will reassess    Return in about 2 weeks (around 11/18/2023) for chronic disease f/u.    The patient was advised to call back or seek an in-person evaluation if the symptoms worsen or if the condition fails to improve as anticipated.  I discussed the assessment and treatment plan with the patient. The patient was provided an opportunity to ask questions and all were answered. The patient agreed with the plan and demonstrated an understanding of the instructions.  I, Galen Russman, PA-C have reviewed all documentation for this visit. The documentation on  11/04/2023   for the exam, diagnosis, procedures, and orders are all accurate and complete.  Blane Bunting, [facility], MMS [facility] [phone] (phone) [phone] (fax)  [facility]

## 2023-11-05 DIAGNOSIS — M109 Gout, unspecified: Secondary | ICD-10-CM | POA: Insufficient documentation

## 2023-11-05 DIAGNOSIS — F1290 Cannabis use, unspecified, uncomplicated: Secondary | ICD-10-CM | POA: Insufficient documentation

## 2023-11-05 DIAGNOSIS — F339 Major depressive disorder, recurrent, unspecified: Secondary | ICD-10-CM | POA: Insufficient documentation

## 2023-11-05 DIAGNOSIS — R739 Hyperglycemia, unspecified: Secondary | ICD-10-CM | POA: Insufficient documentation

## 2023-11-05 DIAGNOSIS — E66811 Obesity, class 1: Secondary | ICD-10-CM | POA: Insufficient documentation

## 2023-11-05 DIAGNOSIS — R748 Abnormal levels of other serum enzymes: Secondary | ICD-10-CM | POA: Insufficient documentation

## 2023-11-05 DIAGNOSIS — R5383 Other fatigue: Secondary | ICD-10-CM | POA: Insufficient documentation

## 2024-01-20 ENCOUNTER — Ambulatory Visit: Payer: Self-pay | Admitting: Physician Assistant

## 2024-01-20 ENCOUNTER — Other Ambulatory Visit: Payer: Self-pay

## 2024-01-20 LAB — CBC WITH DIFFERENTIAL/PLATELET
Basophils Absolute: 0 x10E3/uL (ref 0.0–0.2)
Basos: 0 %
EOS (ABSOLUTE): 0.1 x10E3/uL (ref 0.0–0.4)
Eos: 1 %
Hematocrit: 40.6 % (ref 37.5–51.0)
Hemoglobin: 13.6 g/dL (ref 13.0–17.7)
Immature Grans (Abs): 0 x10E3/uL (ref 0.0–0.1)
Immature Granulocytes: 0 %
Lymphocytes Absolute: 2.2 x10E3/uL (ref 0.7–3.1)
Lymphs: 37 %
MCH: 31.3 pg (ref 26.6–33.0)
MCHC: 33.5 g/dL (ref 31.5–35.7)
MCV: 94 fL (ref 79–97)
Monocytes Absolute: 0.5 x10E3/uL (ref 0.1–0.9)
Monocytes: 9 %
Neutrophils Absolute: 3.2 x10E3/uL (ref 1.4–7.0)
Neutrophils: 53 %
Platelets: 358 x10E3/uL (ref 150–450)
RBC: 4.34 x10E6/uL (ref 4.14–5.80)
RDW: 13.4 % (ref 11.6–15.4)
WBC: 6 x10E3/uL (ref 3.4–10.8)

## 2024-01-20 LAB — URIC ACID: Uric Acid: 7.4 mg/dL (ref 3.8–8.4)

## 2024-01-20 LAB — COMPREHENSIVE METABOLIC PANEL WITH GFR
ALT: 40 IU/L (ref 0–44)
AST: 20 IU/L (ref 0–40)
Albumin: 4.6 g/dL (ref 4.1–5.1)
Alkaline Phosphatase: 97 IU/L (ref 44–121)
BUN/Creatinine Ratio: 14 (ref 9–20)
BUN: 12 mg/dL (ref 6–24)
Bilirubin Total: <0.2 mg/dL (ref 0.0–1.2)
CO2: 22 mmol/L (ref 20–29)
Calcium: 9.3 mg/dL (ref 8.7–10.2)
Chloride: 104 mmol/L (ref 96–106)
Creatinine, Ser: 0.88 mg/dL (ref 0.76–1.27)
Globulin, Total: 2.5 g/dL (ref 1.5–4.5)
Glucose: 137 mg/dL — ABNORMAL HIGH (ref 70–99)
Potassium: 4.6 mmol/L (ref 3.5–5.2)
Sodium: 141 mmol/L (ref 134–144)
Total Protein: 7.1 g/dL (ref 6.0–8.5)
eGFR: 109 mL/min/1.73 (ref >59)

## 2024-01-20 LAB — LIPID PANEL
Chol/HDL Ratio: 5.9 ratio — ABNORMAL HIGH (ref 0.0–5.0)
Cholesterol, Total: 212 mg/dL — ABNORMAL HIGH (ref 100–199)
HDL: 36 mg/dL — ABNORMAL LOW (ref >39)
LDL Chol Calc (NIH): 126 mg/dL — ABNORMAL HIGH (ref 0–99)
Triglycerides: 284 mg/dL — ABNORMAL HIGH (ref 0–149)
VLDL Cholesterol Cal: 50 mg/dL — ABNORMAL HIGH (ref 5–40)

## 2024-01-20 LAB — TSH: TSH: 1.71 u[IU]/mL (ref 0.450–4.500)

## 2024-01-20 LAB — FSH/LH
FSH: 2.9 m[IU]/mL (ref 1.5–12.4)
LH: 3.4 m[IU]/mL (ref 1.7–8.6)

## 2024-01-20 LAB — HEMOGLOBIN A1C
Est. average glucose Bld gHb Est-mCnc: 154 mg/dL
Hgb A1c MFr Bld: 7 % — ABNORMAL HIGH (ref 4.8–5.6)

## 2024-01-20 LAB — TESTOSTERONE,FREE AND TOTAL
Testosterone, Free: 12.5 pg/mL (ref 6.8–21.5)
Testosterone: 339 ng/dL (ref 264–916)

## 2024-01-20 MED ORDER — ALLOPURINOL 100 MG PO TABS: 100.0000 mg | ORAL_TABLET | Freq: Every day | ORAL | 0 refills | Status: DC

## 2024-01-20 MED ORDER — METFORMIN HCL ER 500 MG PO TB24: 500.0000 mg | ORAL_TABLET | Freq: Every day | ORAL | 0 refills | Status: DC

## 2024-01-20 NOTE — Progress Notes (Signed)
 If patient agrees, please prescribe metformin  Glucophage  XR 500mg  no. 90 0 refill  If patient agrees please start allopurinol  100 Mg, #30 1 refill Needs to schedule follow-up in 1 month or 6 weeks

## 2024-01-20 NOTE — Progress Notes (Signed)
gluc

## 2024-02-08 ENCOUNTER — Telehealth: Payer: Self-pay

## 2024-02-08 NOTE — Telephone Encounter (Signed)
 Copied from CRM #[id number]. Topic: Clinical - Request for Lab/Test Order >> Feb 07, 2024  2:06 PM Donna BRAVO wrote: Reason for CRM: patient calling in. Patient would like labs done before appt. Patient would labs to be ordered

## 2024-02-14 ENCOUNTER — Other Ambulatory Visit: Payer: Self-pay | Admitting: Physician Assistant

## 2024-03-07 ENCOUNTER — Ambulatory Visit: Payer: Self-pay | Admitting: Physician Assistant

## 2024-04-19 ENCOUNTER — Other Ambulatory Visit: Payer: Self-pay | Admitting: Physician Assistant
